# Patient Record
Sex: Female | Born: 2000 | Hispanic: No | Marital: Single | Smoking: Never smoker
Health system: Southern US, Community
[De-identification: ages and names within clinical notes are randomized; demographics above are authoritative.]

## PROBLEM LIST (undated history)

## (undated) DIAGNOSIS — N390 Urinary tract infection, site not specified: Secondary | ICD-10-CM

---
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
## 2019-11-24 IMAGING — US US ABDOMEN COMPLETE
1 series · 13 of 25 positions shown · non-contrast
Comparison: None.

CLINICAL DATA: Abdominal pain

EXAM:
ABDOMEN ULTRASOUND COMPLETE

[Series 1: us abdomen complete · 0.17mm/px · 13 of 111 slices shown]
[im 1/111]
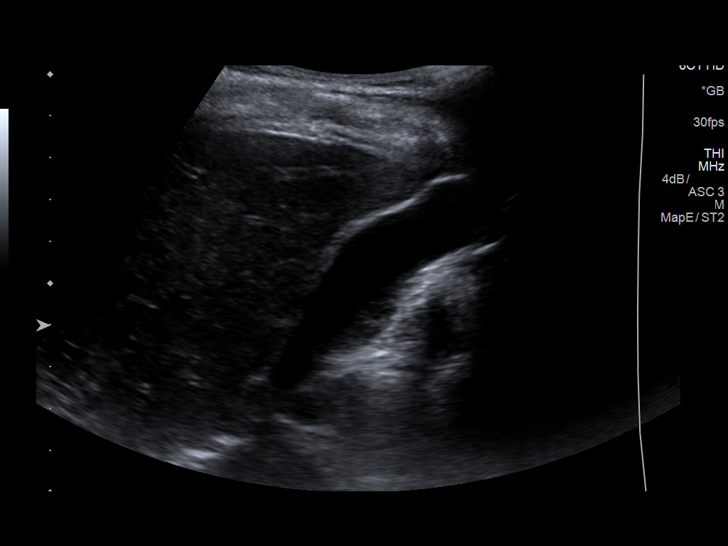
[im 10/111]
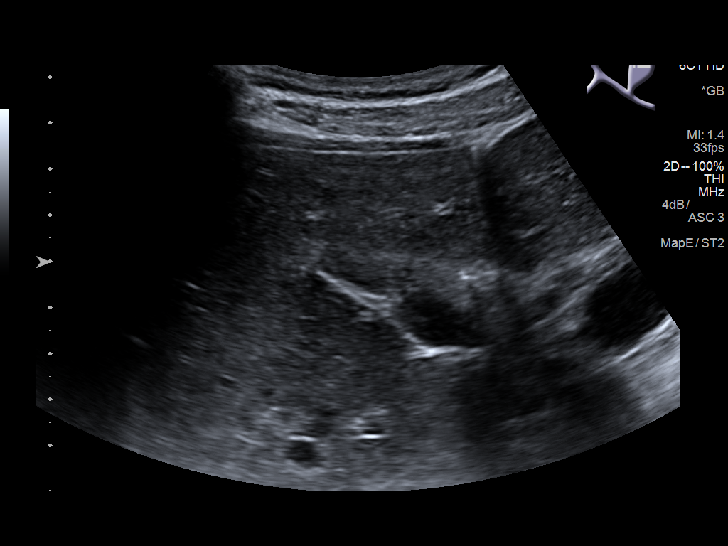
[im 19/111]
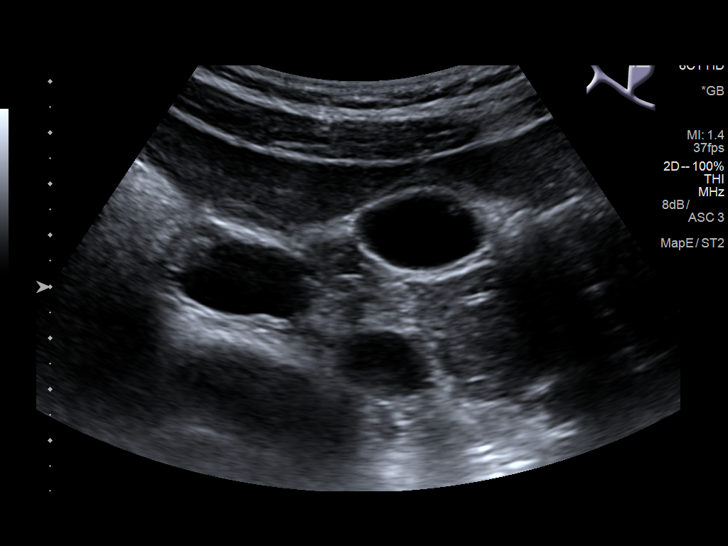
[im 28/111]
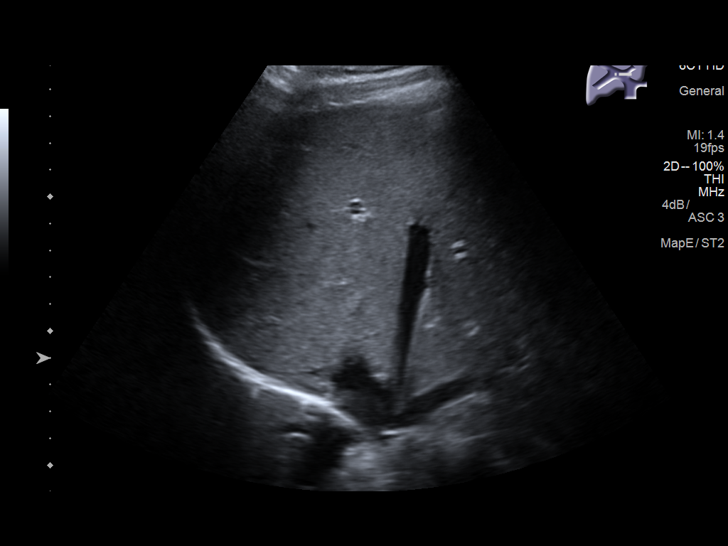
[im 37/111]
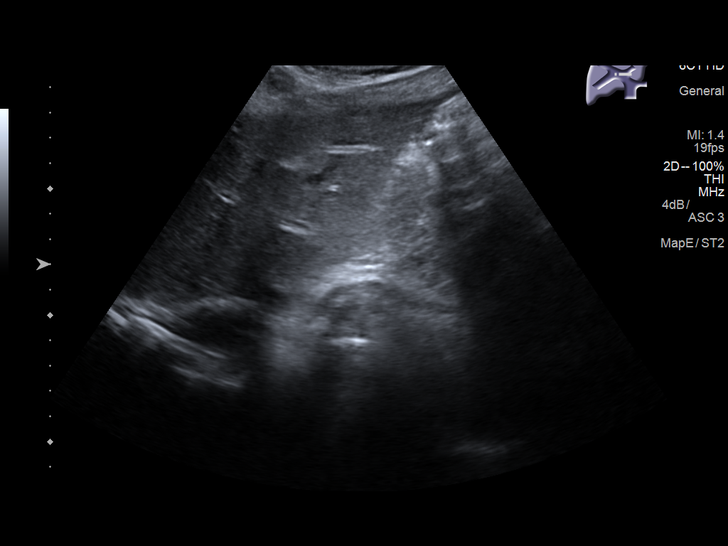
[im 46/111]
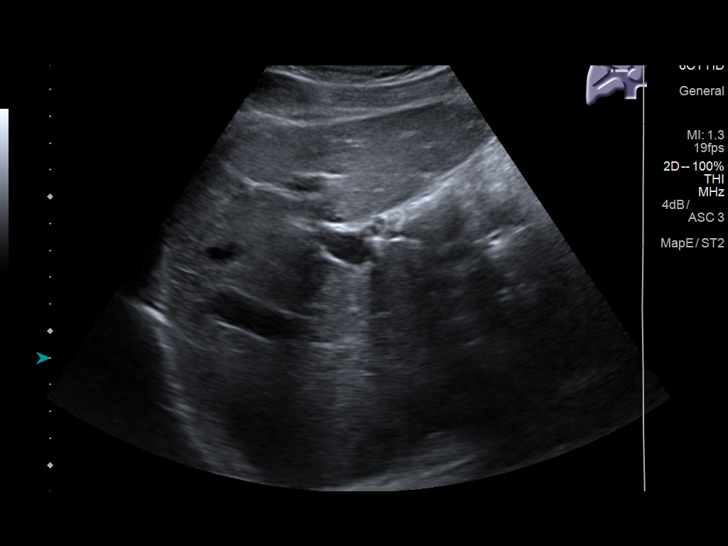
[im 56/111]
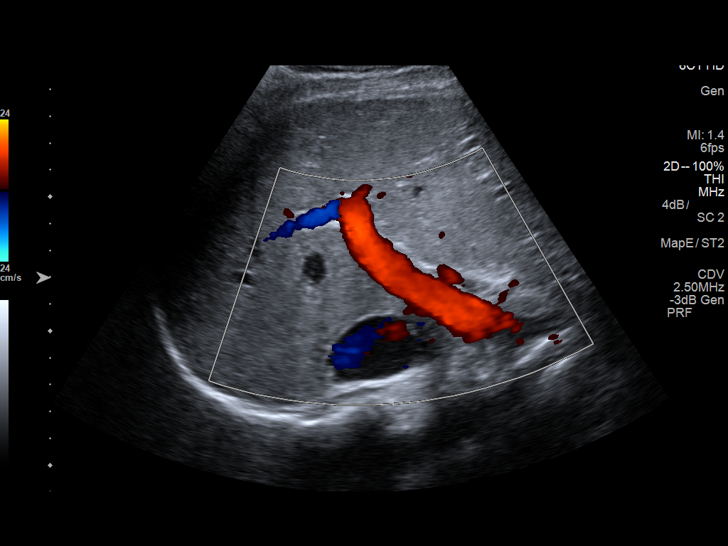
[im 65/111]
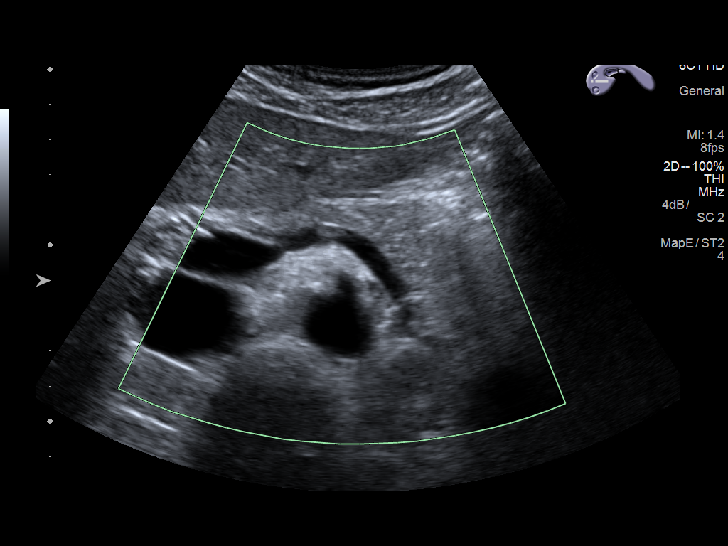
[im 74/111]
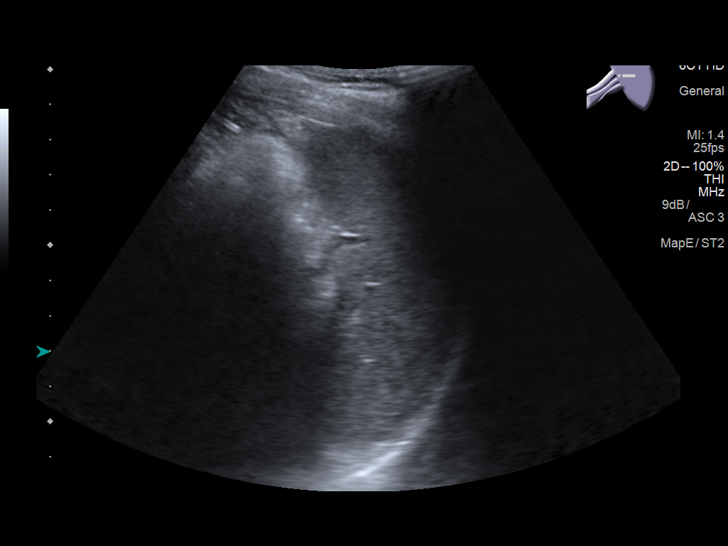
[im 83/111]
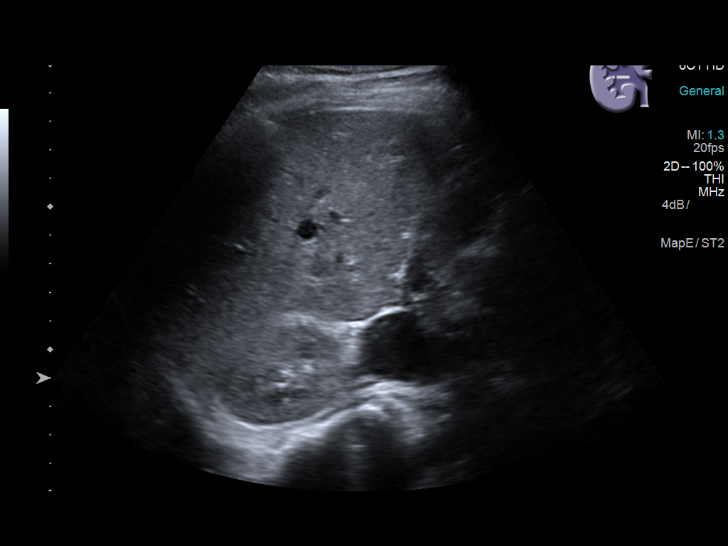
[im 92/111]
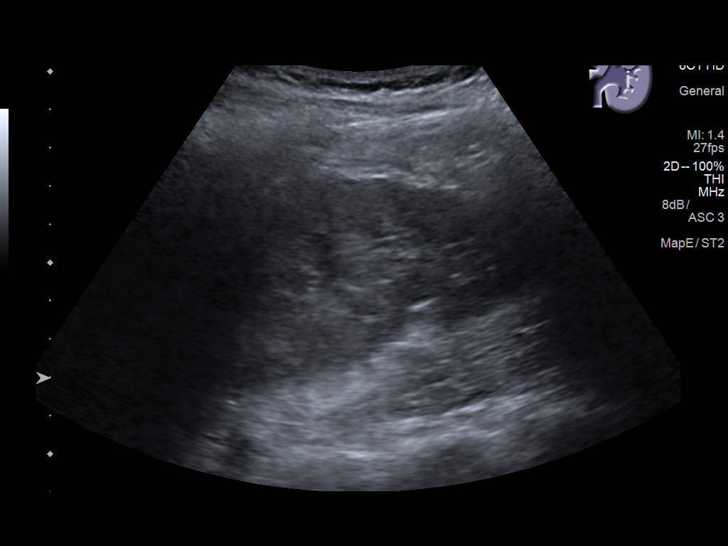
[im 101/111]
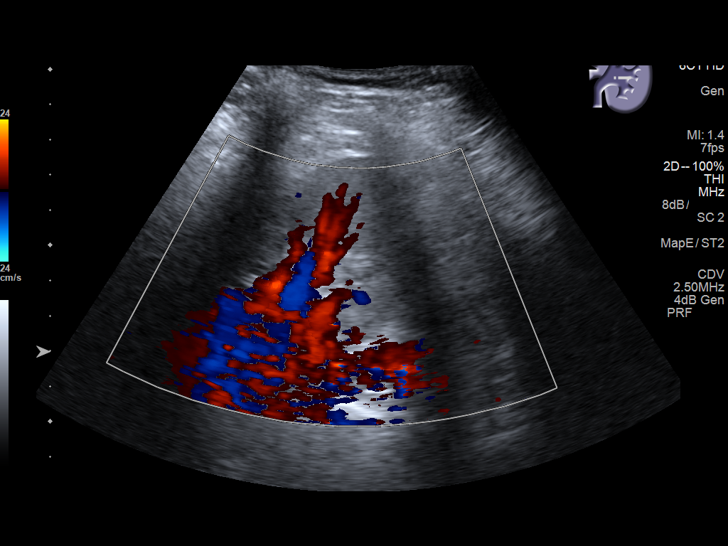
[im 111/111]
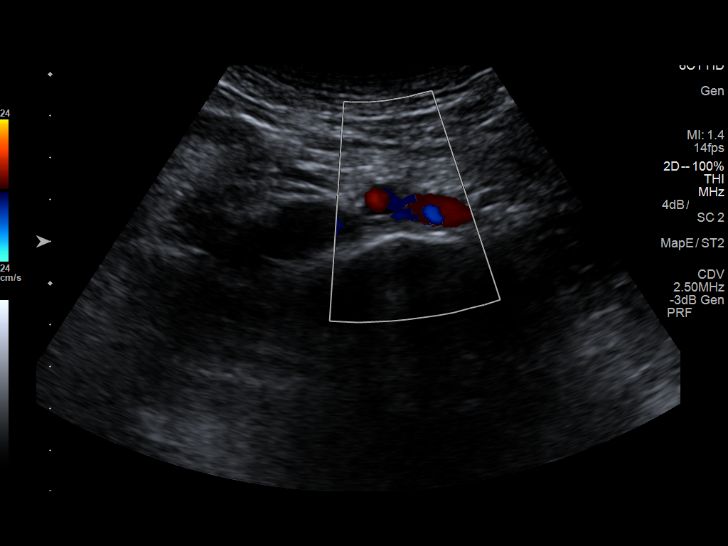

[13 of 25 positions shown; findings below may reference images not displayed]

FINDINGS: Gallbladder: Within the gallbladder, there is a 3 mm echogenic focus
which neither moves nor shadows, felt to represent a small polyp.
There are no echogenic foci in the gallbladder which move and shadow
as is expected with gallstones. No gallbladder wall thickening or
pericholecystic fluid. No sonographic Murphy sign noted by
sonographer.

Common bile duct: Diameter: 4 mm. No intrahepatic, common hepatic,
or common bile duct dilatation.

Liver: No focal lesion identified. Within normal limits in
parenchymal echogenicity. Portal vein is patent on color Doppler
imaging with normal direction of blood flow towards the liver.

IVC: No abnormality visualized.

Pancreas: No pancreatic mass or inflammatory focus.

Spleen: Size and appearance within normal limits.

Right Kidney: Length: 10.4 cm. Echogenicity within normal limits. No
mass or hydronephrosis visualized.

Left Kidney: Length: 10.9 cm. Echogenicity within normal limits. No
mass or hydronephrosis visualized.

Abdominal aorta: No aneurysm visualized.

Other findings: No demonstrable ascites.
IMPRESSION: 3 mm apparent polyp in the gallbladder. Per consensus guidelines, a
polyp of this small size does not warrant additional imaging
surveillance.

Study otherwise unremarkable.
